# Patient Record
Sex: Male | Born: 1965 | Race: White | Hispanic: No
Health system: Southern US, Community
[De-identification: ages and names within clinical notes are randomized; demographics above are authoritative.]

---

## 2013-09-05 ENCOUNTER — Inpatient Hospital Stay: Payer: Self-pay | Admitting: Internal Medicine

## 2013-09-05 LAB — CK-MB: CK-MB: 1.8 ng/mL (ref 0.5–3.6)

## 2013-09-05 LAB — CBC
HCT: 44.1 % (ref 40.0–52.0)
HGB: 14.7 g/dL (ref 13.0–18.0)
MCH: 28.6 pg (ref 26.0–34.0)
MCHC: 33.4 g/dL (ref 32.0–36.0)
MCV: 86 fL (ref 80–100)
PLATELETS: 196 10*3/uL (ref 150–440)
RBC: 5.16 10*6/uL (ref 4.40–5.90)
RDW: 14.4 % (ref 11.5–14.5)
WBC: 7.3 10*3/uL (ref 3.8–10.6)

## 2013-09-05 LAB — BASIC METABOLIC PANEL
Anion Gap: 7 (ref 7–16)
BUN: 11 mg/dL (ref 7–18)
CREATININE: 0.94 mg/dL (ref 0.60–1.30)
Calcium, Total: 8.7 mg/dL (ref 8.5–10.1)
Chloride: 108 mmol/L — ABNORMAL HIGH (ref 98–107)
Co2: 28 mmol/L (ref 21–32)
EGFR (African American): >60
EGFR (Non-African Amer.): >60
Glucose: 74 mg/dL (ref 65–99)
Osmolality: 283 (ref 275–301)
Potassium: 4.1 mmol/L (ref 3.5–5.1)
SODIUM: 143 mmol/L (ref 136–145)

## 2013-09-05 LAB — TROPONIN I
Troponin-I: <0.02 ng/mL
Troponin-I: <0.02 ng/mL

## 2013-09-05 LAB — CK TOTAL AND CKMB (NOT AT ARMC)
CK, TOTAL: 96 U/L
CK-MB: 2 ng/mL (ref 0.5–3.6)

## 2013-10-07 ENCOUNTER — Emergency Department: Payer: Self-pay | Admitting: Emergency Medicine

## 2014-06-29 NOTE — Discharge Summary (Signed)
PATIENT NAME:  Oscar Taylor, [name] MR#:  [id number] DATE OF BIRTH:  12/05/1965  DATE OF ADMISSION:  09/05/2013 DATE OF DISCHARGE:  09/05/2013  The patient actually left against medical advice on September 05, 2013.   ADMISSION DIAGNOSIS: Chest pain/unstable angina.   DISCHARGE DIAGNOSIS: Unstable angina.   HOSPITAL COURSE: A 49 year old male who was admitted for unstable angina. He left AMA shortly after he was admitted.   ____________________________ Janyth ContesSital P. Juliene PinaMody, MD spm:sb D: 09/06/2013 08:00:35 ET T: 09/06/2013 11:22:54 ET JOB#: [id number]  cc: Dustin Bumbaugh P. Juliene PinaMody, MD, <Dictator> Janyth ContesSITAL P Sonny Poth MD ELECTRONICALLY SIGNED 09/06/2013 12:14

## 2014-06-29 NOTE — H&P (Signed)
PATIENT NAME:  Oscar Taylor, Oscar Taylor MR#:  [id number] DATE OF BIRTH:  10-16-1965  DATE OF ADMISSION:  09/05/2013  PRIMARY CARE PHYSICIAN: None.   CHIEF COMPLAINT: Chest pain.   HISTORY OF PRESENT ILLNESS: A 49 year old male with a history of coronary artery disease, status post LAD stent in 2014, who presents with chest pain. The patient says that over the past 3 days, he has had on-and-off chest pain lasting minutes to hours. It starts on the left side, radiates to his left arm, with shortness of breath. He also is complaining of bilateral shoulder pain. He says his chest pain is worse with exertion, better after taking aspirin today. Now, he is not having any chest pain currently. In the ER, his troponin is negative. EKG showed no acute changes.   REVIEW OF SYSTEMS: CONSTITUTIONAL: No fever, fatigue, weakness. EYES: No blurred or double vision or glaucoma.  ENT: No ear pain, hearing loss seasonal allergies, postnasal drip.  RESPIRATORY: No cough, wheezing, hemoptysis, dyspnea. CARDIOVASCULAR: Positive chest pain. No orthopnea, edema, arrhythmia, dyspnea on exertion, palpitations, syncope.  GASTROINTESTINAL: No nausea, vomiting, diarrhea, abdominal pain, melena or ulcers. GENITOURINARY: No dysuria or hematuria.  ENDOCRINE: No polyuria or polydipsia. HEMATOLOGIC AND LYMPHATIC: No easy bruising or bleeding. SKIN: No rash or lesions. MUSCULOSKELETAL: Positive bilateral shoulder pain.  NEUROLOGIC: No history of CVA, TIA or seizures. PSYCHIATRIC: No history of anxiety or depression.   PAST MEDICAL HISTORY: 1. History of CAD, status post stents.  2. Hyperlipidemia.   MEDICATIONS:  1. Plavix. The patient ran out of this about 2 weeks ago.  2. Metoprolol, unknown dose.   ALLERGIES: No known drug allergies.   SOCIAL HISTORY: The patient smokes about 3/4 pack a day. He is not interested in quitting at this time. No IV drug use. Occasional alcohol use.   FAMILY HISTORY: He does not know his family  history.   PAST SURGICAL HISTORY: None.   PHYSICAL EXAMINATION:  VITAL SIGNS: Temperature is afebrile, blood pressure is 100/77, respirations 16, pulse is 78, 100% on room air.  GENERAL: The patient is alert and oriented, not in acute distress. HEENT: Head is atraumatic. Pupils are round and reactive. Sclerae anicteric. Mucous membranes are moist. Oropharynx is clear.  NECK: Supple, without JVD, carotid bruit or enlarged thyroid.  CARDIOVASCULAR: Regular rate and rhythm. No murmurs, gallops or rubs. PMI is not displaced.  ABDOMEN: Bowel sounds positive. Nontender, nondistended. No hepatosplenomegaly.  EXTREMITIES: No clubbing, cyanosis or edema.  SKIN: Without rash or lesions.  MUSCULOSKELETAL: He has limited movement in his upper shoulders. No joint tenderness or swelling. All other lower extremity strength is 5 out of 5.  NEUROLOGIC: Cranial nerves II through XII are grossly intact, without any focal deficits.   LABORATORIES:  Troponin less than 0.02. White blood cells 7.3, hemoglobin 14.7, hematocrit 45, platelets are 196.  Sodium 143, potassium 4.1, chloride 108, bicarbonate is 28, BUN 11, creatinine 0.94, glucose is 74.  Chest x-ray shows no active disease.  EKG: Normal sinus rhythm. He does have some T waves in the anterior leads.   ASSESSMENT AND PLAN: A 49 year old male [place] with a history of coronary artery disease, recent stents, who ran out of his Plavix about 2 weeks ago, presents with unstable angina.   1. Unstable angina. The patient has a good story for coronary artery disease. He does have some pinpoint tenderness on palpation of the chest wall, but given his recent stents and the fact that he has been  off of his Plavix, he needs further cardiac workup. I have consulted Dr. Mariah Milling, who is on call for cardiology. They also ordered a stress test. Continue to cycle his cardiac enzymes. Have started aspirin. Continue Plavix, statin and metoprolol.  2.  History of coronary artery disease. Will continue beta blocker, statin and Plavix.  3. History of hyperlipidemia. Will check fasting lipids and start Zocor.  4. Tobacco dependence. The patient was encouraged to stop smoking. He does want a nicotine patch. The patient was counseled for 3-1/2 minutes.  CODE STATUS: The patient is full code status.   The patient will need a PCP prior to discharge.   TIME SPENT: Approximately 50 minutes.   ____________________________ Janyth Contes. Juliene Pina, MD spm:lb D: 09/05/2013 08:29:16 ET T: 09/05/2013 08:43:18 ET JOB#: [id number]  cc: Adrin Julian P. Juliene Pina, MD, <Dictator> Janyth Contes Liela Rylee MD ELECTRONICALLY SIGNED 09/05/2013 13:38
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
## 2015-12-14 IMAGING — CR DG SHOULDER 3+V*L*
1 series · 3 of 3 positions shown · non-contrast
Comparison: None.

CLINICAL DATA: Shoulder pain

EXAM:
DG SHOULDER 3+VIEWS LEFT

[Series 1: w shoulder grashey left · 0.14mm/px · 3 of 3 slices shown]
[im 1/3]
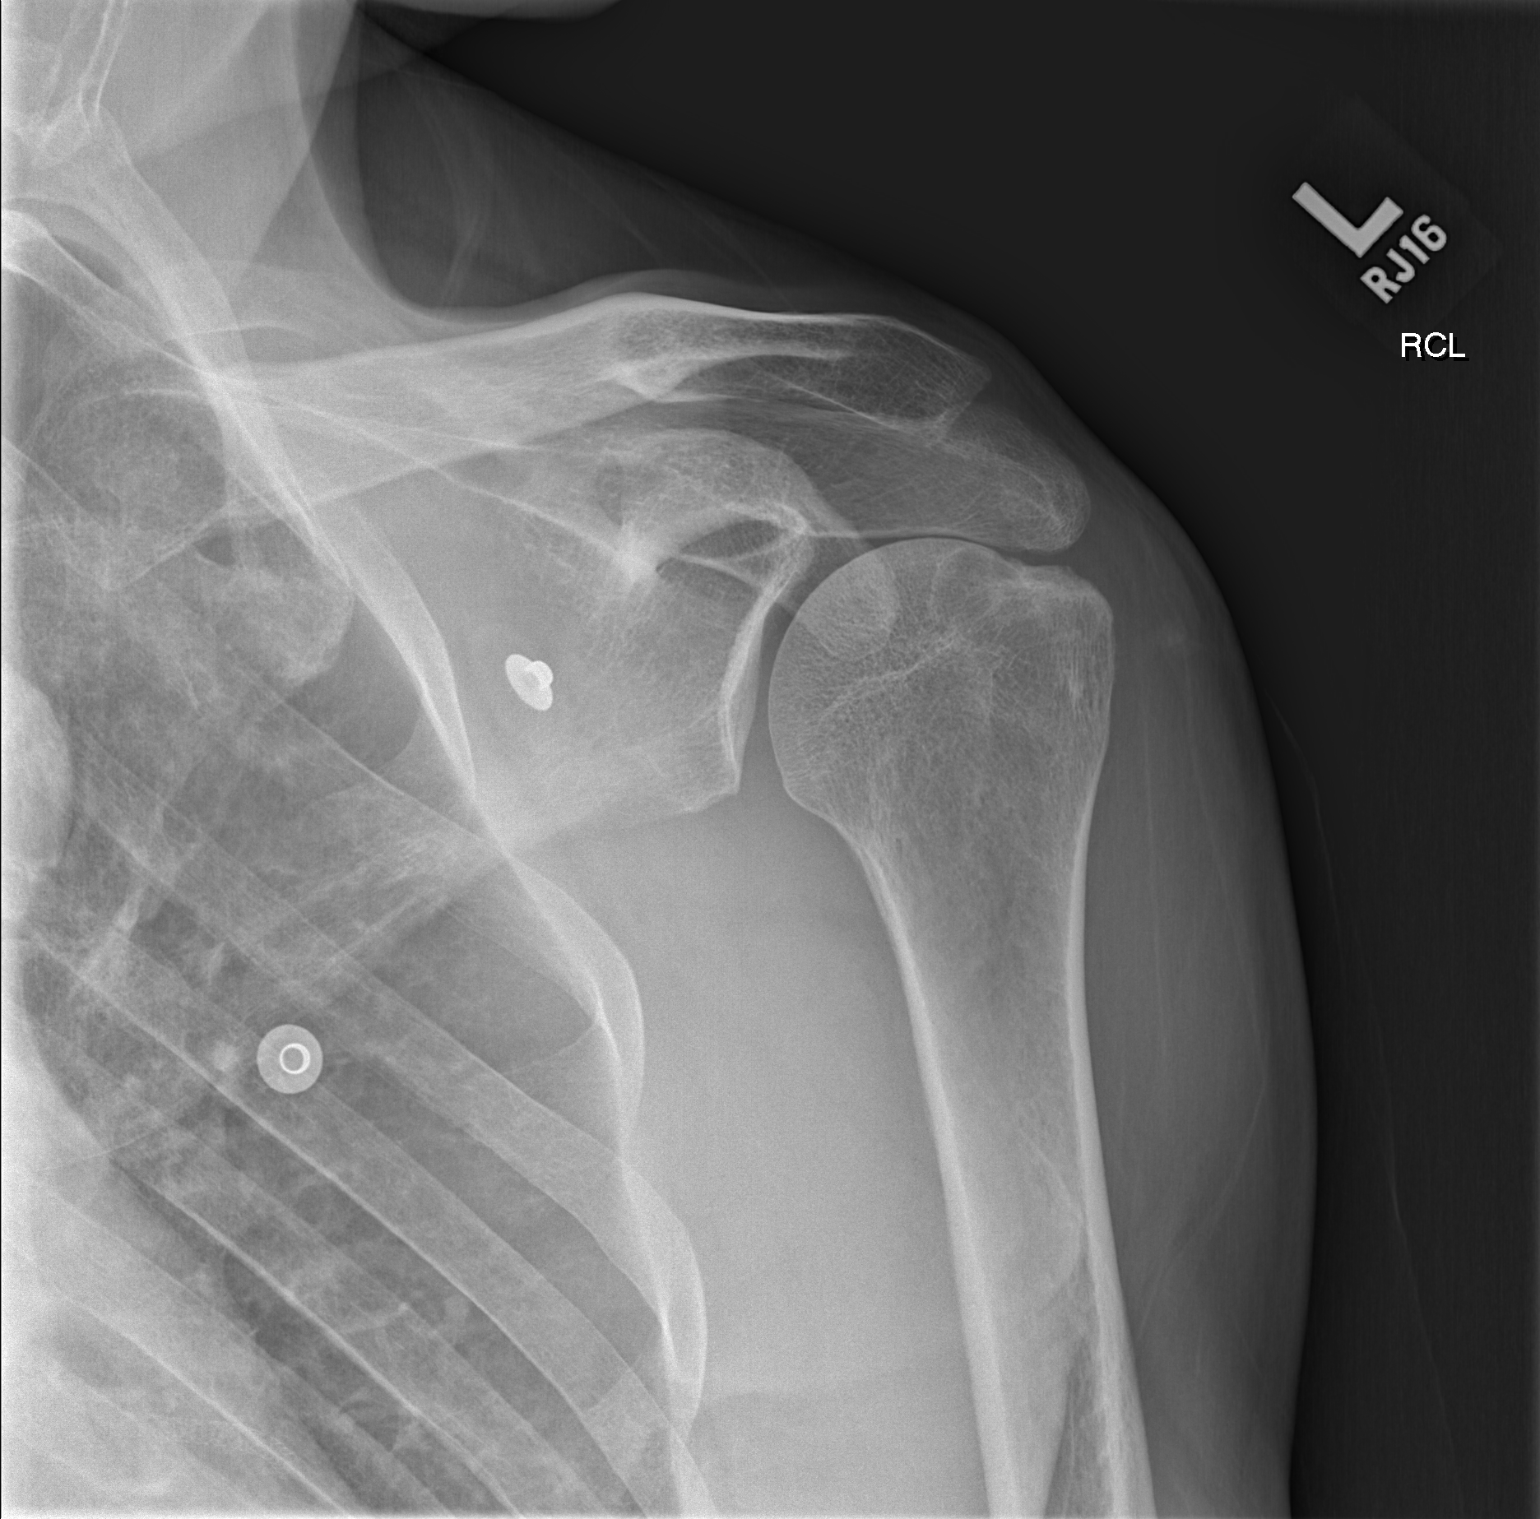
[im 2/3]
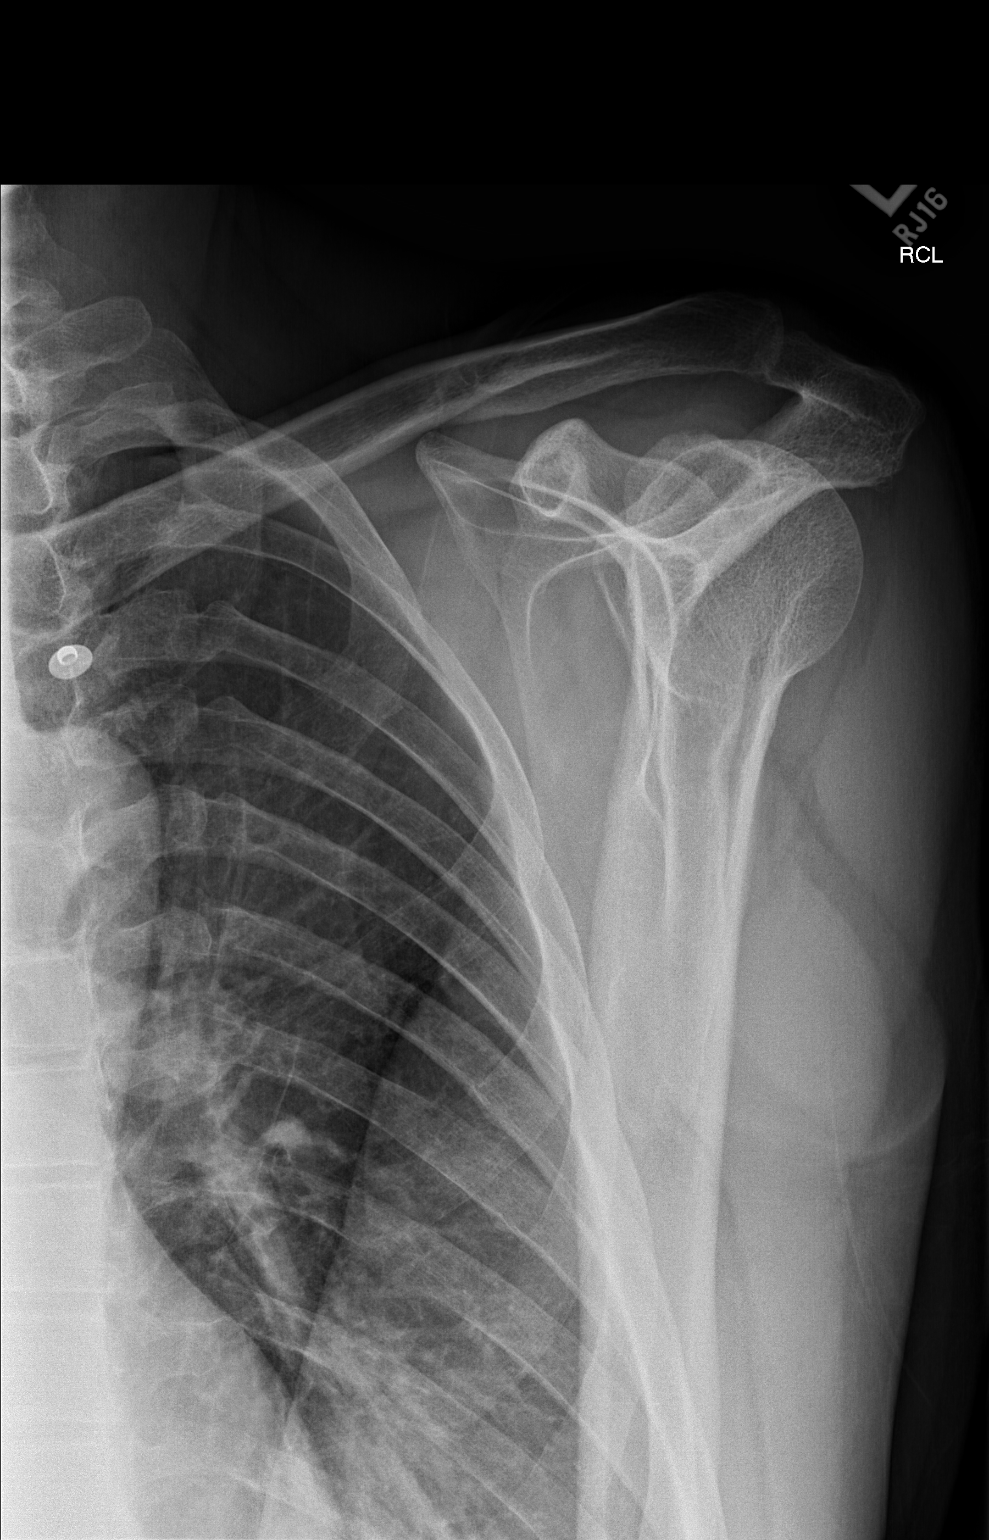
[im 3/3]
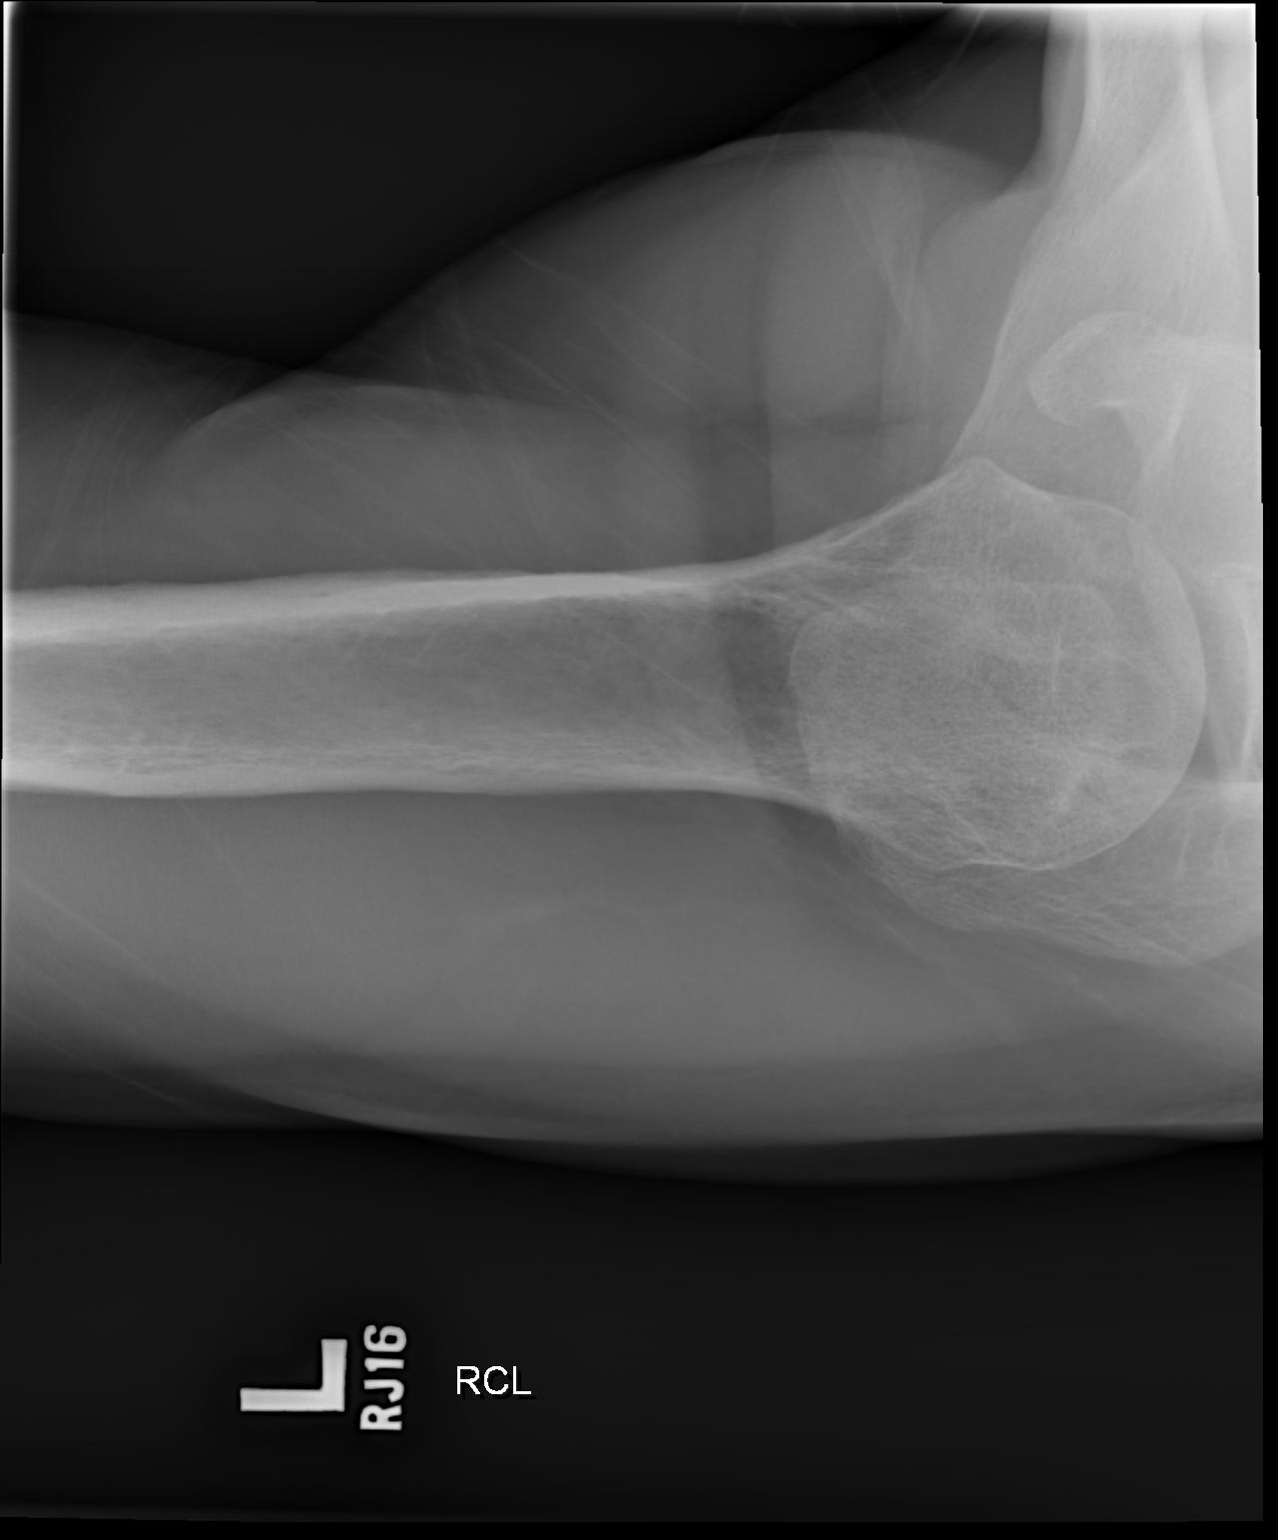

[3 of 3 positions shown; findings below may reference images not displayed]

FINDINGS: No acute fracture. No dislocation. Chronic changes at the
superolateral humeral head. Unremarkable soft tissues.
IMPRESSION: No acute bony pathology.  Chronic changes.
